# Patient Record
Sex: Female | Born: 1965 | Race: White | Hispanic: No | Smoking: Never smoker
Health system: Southern US, Community
[De-identification: ages and names within clinical notes are randomized; demographics above are authoritative.]

## PROBLEM LIST (undated history)

## (undated) DIAGNOSIS — E063 Autoimmune thyroiditis: Secondary | ICD-10-CM

## (undated) DIAGNOSIS — H539 Unspecified visual disturbance: Secondary | ICD-10-CM

## (undated) DIAGNOSIS — K219 Gastro-esophageal reflux disease without esophagitis: Secondary | ICD-10-CM

## (undated) DIAGNOSIS — J302 Other seasonal allergic rhinitis: Secondary | ICD-10-CM

## (undated) DIAGNOSIS — R112 Nausea with vomiting, unspecified: Secondary | ICD-10-CM

## (undated) DIAGNOSIS — B001 Herpesviral vesicular dermatitis: Secondary | ICD-10-CM

## (undated) DIAGNOSIS — Z9889 Other specified postprocedural states: Secondary | ICD-10-CM

## (undated) DIAGNOSIS — E079 Disorder of thyroid, unspecified: Secondary | ICD-10-CM

## (undated) HISTORY — DX: Unspecified visual disturbance: H53.9

## (undated) HISTORY — DX: Disorder of thyroid, unspecified: E07.9

## (undated) HISTORY — DX: Herpesviral vesicular dermatitis: B00.1

## (undated) HISTORY — DX: Autoimmune thyroiditis: E06.3

---
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
## 1997-09-01 ENCOUNTER — Emergency Department (HOSPITAL_COMMUNITY): Admission: EM | Admit: 1997-09-01 | Discharge: 1997-09-01 | Payer: Self-pay | Admitting: Internal Medicine

## 1998-04-29 ENCOUNTER — Other Ambulatory Visit: Admission: RE | Admit: 1998-04-29 | Discharge: 1998-04-29 | Payer: Self-pay | Admitting: *Deleted

## 1998-05-14 ENCOUNTER — Emergency Department (HOSPITAL_COMMUNITY): Admission: EM | Admit: 1998-05-14 | Discharge: 1998-05-14 | Payer: Self-pay | Admitting: Internal Medicine

## 2000-04-25 ENCOUNTER — Emergency Department (HOSPITAL_COMMUNITY): Admission: EM | Admit: 2000-04-25 | Discharge: 2000-04-25 | Payer: Self-pay | Admitting: Emergency Medicine

## 2000-05-27 ENCOUNTER — Other Ambulatory Visit: Admission: RE | Admit: 2000-05-27 | Discharge: 2000-05-27 | Payer: Self-pay | Admitting: *Deleted

## 2007-01-27 HISTORY — PX: CARPAL TUNNEL RELEASE: SHX101

## 2007-01-27 HISTORY — PX: THYROIDECTOMY: SHX17

## 2009-07-01 ENCOUNTER — Encounter: Admission: RE | Admit: 2009-07-01 | Discharge: 2009-07-01 | Payer: Self-pay

## 2010-01-26 HISTORY — PX: THYROIDECTOMY: SHX17

## 2012-01-04 ENCOUNTER — Ambulatory Visit (INDEPENDENT_AMBULATORY_CARE_PROVIDER_SITE_OTHER): Payer: Medicaid Other | Admitting: Obstetrics and Gynecology

## 2012-01-04 ENCOUNTER — Other Ambulatory Visit (HOSPITAL_COMMUNITY)
Admission: RE | Admit: 2012-01-04 | Discharge: 2012-01-04 | Disposition: A | Discharge status: Home / Self Care | Payer: Medicaid Other | Source: Ambulatory Visit | Attending: Obstetrics and Gynecology | Admitting: Obstetrics and Gynecology

## 2012-01-04 ENCOUNTER — Encounter: Payer: Self-pay | Admitting: Obstetrics and Gynecology

## 2012-01-04 VITALS — BP 107/77 | HR 85 | Temp 98.9°F | Ht 65.0 in | Wt 212.8 lb

## 2012-01-04 DIAGNOSIS — N760 Acute vaginitis: Secondary | ICD-10-CM | POA: Insufficient documentation

## 2012-01-04 DIAGNOSIS — Z302 Encounter for sterilization: Secondary | ICD-10-CM | POA: Insufficient documentation

## 2012-01-04 NOTE — Progress Notes (Signed)
Patient states she's been on antibiotic and thinks she could have a yeast infection

## 2012-01-04 NOTE — Progress Notes (Signed)
  Subjective:    Patient ID: Mikayla Crawford, female    DOB: 12/23/1965, 46 y.o.   MRN: [id number]  HPI  46 yo G2P1011 with BMI 35 and LMP 11/21 presenting today requesting permanent sterilization. Patient is currently sexually active using condoms. Patient also reports vaginal pruritis following an antibiotic course and desires prescription for possible yeast infection. Patient is otherwise without any complaints.  Review of Systems  All other systems reviewed and are negative.       Objective:   Physical Exam  GENERAL: Well-developed, well-nourished female in no acute distress.  HEENT: Normocephalic, atraumatic. Sclerae anicteric.  NECK: Supple. Normal thyroid.  LUNGS: Clear to auscultation bilaterally.  HEART: Regular rate and rhythm. ABDOMEN: Soft, nontender, nondistended. No organomegaly. PELVIC: Normal external female genitalia. Vagina is pink and rugated.  Normal discharge. Normal appearing cervix. Uterus is normal in size. No adnexal mass or tenderness. EXTREMITIES: No cyanosis, clubbing, or edema, 2+ distal pulses.    Assessment & Plan:  46 y.o. yo G2P1011  with undesired fertility, status post vaginal delivery, desires permanent sterilization. Risks and benefits of procedure discussed with patient including permanence of method, bleeding, infection, injury to surrounding organs and need for additional procedures. Risk failure of 0.5-1% with increased risk of ectopic gestation if pregnancy occurs was also discussed with patient. Patient will be scheduled for laparoscopic BTL  Wet prep collected. Patient will be contacted with any abnormal results

## 2012-01-21 ENCOUNTER — Encounter: Payer: Self-pay | Admitting: Obstetrics & Gynecology

## 2012-02-15 ENCOUNTER — Encounter (HOSPITAL_COMMUNITY)
Admission: RE | Admit: 2012-02-15 | Discharge: 2012-02-15 | Disposition: A | Discharge status: Home / Self Care | Payer: Medicaid Other | Source: Ambulatory Visit | Attending: Obstetrics and Gynecology | Admitting: Obstetrics and Gynecology

## 2012-02-15 ENCOUNTER — Encounter (HOSPITAL_COMMUNITY): Payer: Self-pay

## 2012-02-15 ENCOUNTER — Encounter (HOSPITAL_COMMUNITY): Payer: Self-pay | Admitting: Pharmacy Technician

## 2012-02-15 HISTORY — DX: Other seasonal allergic rhinitis: J30.2

## 2012-02-15 HISTORY — DX: Gastro-esophageal reflux disease without esophagitis: K21.9

## 2012-02-15 HISTORY — DX: Other specified postprocedural states: R11.2

## 2012-02-15 HISTORY — DX: Other specified postprocedural states: Z98.890

## 2012-02-15 LAB — CBC
Platelets: 243 10*3/uL (ref 150–400)
RBC: 4.23 MIL/uL (ref 3.87–5.11)

## 2012-02-15 NOTE — Pre-Procedure Instructions (Signed)
Pt states she has some recent congestion-will see primary care this week

## 2012-02-15 NOTE — Patient Instructions (Addendum)
   Your procedure is scheduled on: Thursday January 30th  Enter through the Main Entrance of [facility] at:11:30am Pick up the phone at the desk and dial [phone] and inform us of your arrival.  Please call this number if you have any problems the morning of surgery: [phone]  Remember: Do not eat any solid foods after midnight on Wednesday  You may have clear liquids until 9am on Thursday then nothing You may take your thyroid medication and zyrtec morning of surgery  Do not wear jewelry, make-up, or FINGER nail polish No metal in your hair or on your body. Do not wear lotions, powders, perfumes. You may wear deodorant.  Please use your CHG wash as directed prior to surgery.  Do not shave anywhere for at least 12 hours prior to first CHG shower.  Do not bring valuables to the hospital. Please bring a case to protect your eyeglasses    Patients discharged on the day of surgery will not be allowed to drive home.

## 2012-02-25 ENCOUNTER — Encounter (HOSPITAL_COMMUNITY): Payer: Self-pay | Admitting: Anesthesiology

## 2012-02-25 ENCOUNTER — Encounter (HOSPITAL_COMMUNITY)
Admission: RE | Disposition: A | Discharge status: Home / Self Care | Payer: Self-pay | Source: Ambulatory Visit | Attending: Obstetrics and Gynecology

## 2012-02-25 ENCOUNTER — Ambulatory Visit (HOSPITAL_COMMUNITY): Payer: Medicaid Other | Admitting: Anesthesiology

## 2012-02-25 ENCOUNTER — Ambulatory Visit (HOSPITAL_COMMUNITY)
Admission: RE | Admit: 2012-02-25 | Discharge: 2012-02-25 | Disposition: A | Discharge status: Home / Self Care | Payer: Medicaid Other | Source: Ambulatory Visit | Attending: Obstetrics and Gynecology | Admitting: Obstetrics and Gynecology

## 2012-02-25 DIAGNOSIS — Z01812 Encounter for preprocedural laboratory examination: Secondary | ICD-10-CM | POA: Insufficient documentation

## 2012-02-25 DIAGNOSIS — Z302 Encounter for sterilization: Secondary | ICD-10-CM | POA: Insufficient documentation

## 2012-02-25 DIAGNOSIS — Z01818 Encounter for other preprocedural examination: Secondary | ICD-10-CM | POA: Insufficient documentation

## 2012-02-25 HISTORY — PX: LAPAROSCOPIC TUBAL LIGATION: SHX1937

## 2012-02-25 LAB — PREGNANCY, URINE: Preg Test, Ur: NEGATIVE

## 2012-02-25 SURGERY — LIGATION, FALLOPIAN TUBE, LAPAROSCOPIC
Anesthesia: General | Site: Abdomen | Laterality: Bilateral | Wound class: Clean Contaminated

## 2012-02-25 MED ORDER — NEOSTIGMINE METHYLSULFATE 1 MG/ML IJ SOLN
INTRAMUSCULAR | Status: DC | PRN
  Administered 2012-02-25: 3 mg via INTRAVENOUS

## 2012-02-25 MED ORDER — FENTANYL CITRATE 0.05 MG/ML IJ SOLN
INTRAMUSCULAR | Status: AC
  Administered 2012-02-25: 50 ug via INTRAVENOUS
  Filled 2012-02-25: qty 2

## 2012-02-25 MED ORDER — ONDANSETRON HCL 4 MG/2ML IJ SOLN
INTRAMUSCULAR | Status: DC | PRN
  Administered 2012-02-25: 4 mg via INTRAVENOUS

## 2012-02-25 MED ORDER — MIDAZOLAM HCL 2 MG/2ML IJ SOLN
INTRAMUSCULAR | Status: AC
  Filled 2012-02-25: qty 2

## 2012-02-25 MED ORDER — LIDOCAINE HCL (CARDIAC) 20 MG/ML IV SOLN
INTRAVENOUS | Status: DC | PRN
  Administered 2012-02-25: 80 mg via INTRAVENOUS

## 2012-02-25 MED ORDER — EPHEDRINE 5 MG/ML INJ
INTRAVENOUS | Status: AC
  Filled 2012-02-25: qty 10

## 2012-02-25 MED ORDER — DEXAMETHASONE SODIUM PHOSPHATE 10 MG/ML IJ SOLN
INTRAMUSCULAR | Status: DC | PRN
  Administered 2012-02-25: 10 mg via INTRAVENOUS

## 2012-02-25 MED ORDER — NEOSTIGMINE METHYLSULFATE 1 MG/ML IJ SOLN
INTRAMUSCULAR | Status: AC
  Filled 2012-02-25: qty 1

## 2012-02-25 MED ORDER — GLYCOPYRROLATE 0.2 MG/ML IJ SOLN
INTRAMUSCULAR | Status: DC | PRN
  Administered 2012-02-25: 0.6 mg via INTRAVENOUS

## 2012-02-25 MED ORDER — DEXAMETHASONE SODIUM PHOSPHATE 10 MG/ML IJ SOLN
INTRAMUSCULAR | Status: AC
  Filled 2012-02-25: qty 1

## 2012-02-25 MED ORDER — KETOROLAC TROMETHAMINE 30 MG/ML IJ SOLN: 15.0000 mg | Freq: Once | INTRAMUSCULAR | Status: DC | PRN

## 2012-02-25 MED ORDER — ROCURONIUM BROMIDE 100 MG/10ML IV SOLN
INTRAVENOUS | Status: DC | PRN
  Administered 2012-02-25: 30 mg via INTRAVENOUS

## 2012-02-25 MED ORDER — BUPIVACAINE HCL (PF) 0.25 % IJ SOLN
INTRAMUSCULAR | Status: AC
  Filled 2012-02-25: qty 30

## 2012-02-25 MED ORDER — FENTANYL CITRATE 0.05 MG/ML IJ SOLN
INTRAMUSCULAR | Status: AC
  Filled 2012-02-25: qty 5

## 2012-02-25 MED ORDER — ONDANSETRON HCL 4 MG/2ML IJ SOLN
INTRAMUSCULAR | Status: AC
  Filled 2012-02-25: qty 2

## 2012-02-25 MED ORDER — ONDANSETRON HCL 4 MG/2ML IJ SOLN: 4.0000 mg | Freq: Once | INTRAMUSCULAR | Status: DC | PRN

## 2012-02-25 MED ORDER — BUPIVACAINE HCL (PF) 0.25 % IJ SOLN
INTRAMUSCULAR | Status: DC | PRN
  Administered 2012-02-25: 15 mL

## 2012-02-25 MED ORDER — LIDOCAINE HCL (CARDIAC) 20 MG/ML IV SOLN
INTRAVENOUS | Status: AC
  Filled 2012-02-25: qty 5

## 2012-02-25 MED ORDER — FENTANYL CITRATE 0.05 MG/ML IJ SOLN
25.0000 ug | INTRAMUSCULAR | Status: DC | PRN
  Administered 2012-02-25: 50 ug via INTRAVENOUS

## 2012-02-25 MED ORDER — GLYCOPYRROLATE 0.2 MG/ML IJ SOLN
INTRAMUSCULAR | Status: AC
  Filled 2012-02-25: qty 3

## 2012-02-25 MED ORDER — PROPOFOL 10 MG/ML IV EMUL
INTRAVENOUS | Status: AC
  Filled 2012-02-25: qty 20

## 2012-02-25 MED ORDER — FENTANYL CITRATE 0.05 MG/ML IJ SOLN
INTRAMUSCULAR | Status: DC | PRN
  Administered 2012-02-25: 100 ug via INTRAVENOUS
  Administered 2012-02-25 (×3): 50 ug via INTRAVENOUS

## 2012-02-25 MED ORDER — KETOROLAC TROMETHAMINE 30 MG/ML IJ SOLN
INTRAMUSCULAR | Status: DC | PRN
  Administered 2012-02-25: 30 mg via INTRAVENOUS

## 2012-02-25 MED ORDER — MEPERIDINE HCL 25 MG/ML IJ SOLN: 6.2500 mg | INTRAMUSCULAR | Status: DC | PRN

## 2012-02-25 MED ORDER — LACTATED RINGERS IV SOLN
INTRAVENOUS | Status: DC
  Administered 2012-02-25 (×2): via INTRAVENOUS

## 2012-02-25 MED ORDER — SCOPOLAMINE 1 MG/3DAYS TD PT72
MEDICATED_PATCH | TRANSDERMAL | Status: AC
  Administered 2012-02-25: 1.5 mg via TRANSDERMAL
  Filled 2012-02-25: qty 1

## 2012-02-25 MED ORDER — KETOROLAC TROMETHAMINE 30 MG/ML IJ SOLN
INTRAMUSCULAR | Status: AC
  Filled 2012-02-25: qty 1

## 2012-02-25 MED ORDER — EPHEDRINE SULFATE 50 MG/ML IJ SOLN
INTRAMUSCULAR | Status: DC | PRN
  Administered 2012-02-25: 10 mg via INTRAVENOUS
  Administered 2012-02-25: 5 mg via INTRAVENOUS

## 2012-02-25 MED ORDER — SCOPOLAMINE 1 MG/3DAYS TD PT72
1.0000 | MEDICATED_PATCH | TRANSDERMAL | Status: DC
  Administered 2012-02-25: 1.5 mg via TRANSDERMAL

## 2012-02-25 MED ORDER — PROPOFOL 10 MG/ML IV BOLUS
INTRAVENOUS | Status: DC | PRN
  Administered 2012-02-25: 200 mg via INTRAVENOUS

## 2012-02-25 MED ORDER — OXYCODONE-ACETAMINOPHEN 5-325 MG PO TABS: 1.0000 | ORAL_TABLET | ORAL | Status: DC | PRN

## 2012-02-25 MED ORDER — IBUPROFEN 600 MG PO TABS: 600.0000 mg | ORAL_TABLET | Freq: Four times a day (QID) | ORAL | Status: DC | PRN

## 2012-02-25 MED ORDER — ROCURONIUM BROMIDE 50 MG/5ML IV SOLN
INTRAVENOUS | Status: AC
  Filled 2012-02-25: qty 1

## 2012-02-25 MED ORDER — MIDAZOLAM HCL 5 MG/5ML IJ SOLN
INTRAMUSCULAR | Status: DC | PRN
  Administered 2012-02-25: 2 mg via INTRAVENOUS

## 2012-02-25 SURGICAL SUPPLY — 22 items
ADH SKN CLS APL DERMABOND .7 (GAUZE/BANDAGES/DRESSINGS) ×1
CHLORAPREP W/TINT 26ML (MISCELLANEOUS) ×2 IMPLANT
DERMABOND ADVANCED (GAUZE/BANDAGES/DRESSINGS) ×1
DERMABOND ADVANCED .7 DNX12 (GAUZE/BANDAGES/DRESSINGS) IMPLANT
ELECT REM PT RETURN 9FT ADLT (ELECTROSURGICAL) ×2
ELECTRODE REM PT RTRN 9FT ADLT (ELECTROSURGICAL) IMPLANT
GLOVE BIOGEL PI IND STRL 6.5 (GLOVE) ×2 IMPLANT
GLOVE BIOGEL PI INDICATOR 6.5 (GLOVE) ×2
GLOVE SURG SS PI 6.0 STRL IVOR (GLOVE) ×2 IMPLANT
GOWN PREVENTION PLUS LG XLONG (DISPOSABLE) ×4 IMPLANT
NS IRRIG 1000ML POUR BTL (IV SOLUTION) ×2 IMPLANT
PACK LAPAROSCOPY BASIN (CUSTOM PROCEDURE TRAY) ×2 IMPLANT
SEALER TISSUE G2 CVD JAW 35 (ENDOMECHANICALS) IMPLANT
SEALER TISSUE G2 CVD JAW 45CM (ENDOMECHANICALS) ×1
SUT MON AB 4-0 PS1 27 (SUTURE) ×2 IMPLANT
SUT VICRYL 0 UR6 27IN ABS (SUTURE) ×2 IMPLANT
TOWEL OR 17X24 6PK STRL BLUE (TOWEL DISPOSABLE) ×4 IMPLANT
TRAY FOLEY BAG SILVER LF 14FR (CATHETERS) ×2 IMPLANT
TROCAR BALLN 12MMX100 BLUNT (TROCAR) ×1 IMPLANT
TROCAR XCEL NON-BLD 11X100MML (ENDOMECHANICALS) IMPLANT
TROCAR XCEL NON-BLD 5MMX100MML (ENDOMECHANICALS) ×1 IMPLANT
WATER STERILE IRR 1000ML POUR (IV SOLUTION) ×2 IMPLANT

## 2012-02-25 NOTE — Anesthesia Postprocedure Evaluation (Signed)
  Anesthesia Post-op Note  Patient: Mikayla Crawford  Procedure(s) Performed: Procedure(s) (LRB) with comments: LAPAROSCOPIC TUBAL LIGATION (Bilateral)  Patient Location: PACU  Anesthesia Type:General  Level of Consciousness: awake, alert  and oriented  Airway and Oxygen Therapy: Patient Spontanous Breathing  Post-op Pain: mild  Post-op Assessment: Post-op Vital signs reviewed, Patient's Cardiovascular Status Stable, Respiratory Function Stable, Patent Airway, No signs of Nausea or vomiting and Pain level controlled  Post-op Vital Signs: Reviewed and stable  Complications: No apparent anesthesia complications

## 2012-02-25 NOTE — H&P (Signed)
Mikayla Crawford is an 47 y.o. female G2P1011 presenting today for scheduled bilateral tubal ligation. Patient with undesired fertility and desires permanent sterilization  Pertinent Gynecological History: Menses: flow is moderate Bleeding: monthly Contraception: condoms DES exposure: denies Blood transfusions: none Sexually transmitted diseases: no past history Previous GYN Procedures: DNC  Last mammogram: normal Date: 2011 OB History: G2, P1011   Menstrual History:  No LMP recorded.    Past Medical History  Diagnosis Date  . Thyroid disease     no thyroid  . Hashimoto's disease   . PONV (postoperative nausea and vomiting)   . Seasonal allergies   . GERD (gastroesophageal reflux disease)     tums prn for occas reflux    Past Surgical History  Procedure Date  . Carpal tunnel release 2009  . Thyroidectomy 2009  . Tonsillectomy 1988  . Thyroidectomy 1/12    Family History  Problem Relation Age of Onset  . Macular degeneration Mother   . Stroke Mother   . Glaucoma Mother   . Heart disease Mother   . Heart disease Father   . Diabetes Father   . Macular degeneration Father   . Crohn's disease Daughter   . Diabetes Brother   . Cancer Maternal Grandmother     Social History:  reports that she has never smoked. She has never used smokeless tobacco. She reports that she does not drink alcohol or use illicit drugs.  Allergies: No Known Allergies  Prescriptions prior to admission  Medication Sig Dispense Refill  . CALCIUM & MAGNESIUM CARBONATES PO Take 1 tablet by mouth daily.       . Capsicum, Cayenne, (CAYENNE PEPPER PO) Take 1 tablet by mouth daily as needed. For arthritis      . cetirizine (ZYRTEC) 10 MG tablet Take 10 mg by mouth daily.      . Melatonin 5 MG TABS Take 5-10 mg by mouth at bedtime.      Marland Kitchen PRESCRIPTION MEDICATION Take 1 tablet by mouth daily. [facility] compounded RX:  Liothyronine & Levothryoxine      . pseudoephedrine (SUDAFED) 30  MG tablet Take 30 mg by mouth daily as needed. For sinus congestion        Review of Systems  All other systems reviewed and are negative.    Blood pressure 119/78, pulse 90, temperature 98.1 F (36.7 C), temperature source Oral, resp. rate 18, SpO2 100.00%. Physical Exam GENERAL: Well-developed, well-nourished female in no acute distress.  HEENT: Normocephalic, atraumatic. Sclerae anicteric.  NECK: Supple. Normal thyroid.  LUNGS: Clear to auscultation bilaterally.  HEART: Regular rate and rhythm. BREASTS: Symmetric in size. No palpable masses or lymphadenopathy, skin changes, or nipple drainage. ABDOMEN: Soft, nontender, nondistended. No organomegaly. PELVIC: Deferred to OR EXTREMITIES: No cyanosis, clubbing, or edema, 2+ distal pulses.  Results for orders placed during the hospital encounter of 02/25/12 (from the past 24 hour(s))  PREGNANCY, URINE     Status: Normal   Collection Time   02/25/12 11:24 AM      Component Value Range   Preg Test, Ur NEGATIVE  NEGATIVE    No results found.  Assessment/Plan: 47 y.o. yo G2P1011  with undesired fertility,status post vaginal delivery, desires permanent sterilization. Risks and benefits of procedure discussed with patient including permanence of method, bleeding, infection, injury to surrounding organs and need for additional procedures. Risk failure of 0.5-1% with increased risk of ectopic gestation if pregnancy occurs was also discussed with patient.     Normalee Sistare  02/25/2012, 12:03 PM

## 2012-02-25 NOTE — Anesthesia Procedure Notes (Signed)
Procedure Name: Intubation Date/Time: 02/25/2012 1:06 PM Performed by: Graciela Husbands Pre-anesthesia Checklist: Patient identified, Patient being monitored, Timeout performed, Emergency Drugs available and Suction available Patient Re-evaluated:Patient Re-evaluated prior to inductionOxygen Delivery Method: Circle system utilized Preoxygenation: Pre-oxygenation with 100% oxygen Intubation Type: IV induction Ventilation: Mask ventilation without difficulty Laryngoscope Size: Mac and 3 Grade View: Grade I Tube size: 7.0 mm Airway Equipment and Method: Stylet Placement Confirmation: ETT inserted through vocal cords under direct vision,  breath sounds checked- equal and bilateral and positive ETCO2 Secured at: 21 cm Tube secured with: Tape Dental Injury: Teeth and Oropharynx as per pre-operative assessment

## 2012-02-25 NOTE — Anesthesia Preprocedure Evaluation (Addendum)
Anesthesia Evaluation  Patient identified by MRN, date of birth, ID band Patient awake    Reviewed: Allergy & Precautions, H&P , NPO status , Patient's Chart, lab work & pertinent test results  Airway Mallampati: II TM Distance: >3 FB Neck ROM: full    Dental No notable dental hx. (+) Teeth Intact   Pulmonary neg pulmonary ROS,    Pulmonary exam normal       Cardiovascular negative cardio ROS      Neuro/Psych negative neurological ROS  negative psych ROS   GI/Hepatic Neg liver ROS,   Endo/Other  negative endocrine ROS  Renal/GU negative Renal ROS  negative genitourinary   Musculoskeletal negative musculoskeletal ROS (+)   Abdominal Normal abdominal exam  (+)   Peds negative pediatric ROS (+)  Hematology negative hematology ROS (+)   Anesthesia Other Findings   Reproductive/Obstetrics negative OB ROS                           Anesthesia Physical Anesthesia Plan  ASA: II  Anesthesia Plan: General   Post-op Pain Management:    Induction: Intravenous  Airway Management Planned: Oral ETT  Additional Equipment:   Intra-op Plan:   Post-operative Plan: Extubation in OR  Informed Consent: I have reviewed the patients History and Physical, chart, labs and discussed the procedure including the risks, benefits and alternatives for the proposed anesthesia with the patient or authorized representative who has indicated his/her understanding and acceptance.   Dental Advisory Given  Plan Discussed with: CRNA and Surgeon  Anesthesia Plan Comments:        Anesthesia Quick Evaluation

## 2012-02-25 NOTE — Op Note (Signed)
Mikayla Crawford 02/25/2012  PREOPERATIVE DIAGNOSIS:  Undesired fertility  POSTOPERATIVE DIAGNOSIS:  Undesired fertility  PROCEDURE:  Laparoscopic Bilateral salpingectomy  SURGEON: Dr. Catalina Antigua  ANESTHESIA:  General endotracheal  COMPLICATIONS:  None immediate.  ESTIMATED BLOOD LOSS:  Less than 20 ml.  FLUIDS: 1200 ml LR.  URINE OUTPUT:  200 ml of clear urine.  INDICATIONS: 47 y.o. G2P1011  with undesired fertility, desires permanent sterilization. Other reversible forms of contraception were discussed with patient; she declines all other modalities.  Risks of procedure discussed with patient including permanence of method, bleeding, infection, injury to surrounding organs and need for additional procedures including laparotomy, risk of regret.  Failure risk of 0.5-1% with increased risk of ectopic gestation if pregnancy occurs was also discussed with patient.      FINDINGS:  Normal uterus, tubes, and ovaries.  TECHNIQUE:  The patient was taken to the operating room where general anesthesia was obtained without difficulty.  She was then placed in the dorsal lithotomy position and prepared and draped in sterile fashion.  After an adequate timeout was performed, a bivalved speculum was then placed in the patient's vagina, and the anterior lip of cervix grasped with the single-tooth tenaculum.  The uterine manipulator was then advanced into the uterus.  The speculum was removed from the vagina.  Attention was then turned to the patient's abdomen where a 10-mm skin incision was made on the umbilical fold.  The underlying fascia was identified, grasped with Kocher clamps, tented up and entered sharply with mayo scissors. The fascia was tagged with 0-Vicryl. The peritoneum was entered bluntly.The 11 mm trocar and sleeve were introduced into the abdominal cavityl.  Intraperitoneal placement was confirmed with the use of the laparoscope. Pneumoperitoneum was achieved by the insufflation of CO2  gas. A survey of the patient's pelvis and abdomen revealed entirely normal anatomy.  The fallopian tubes were observed and found to be normal in appearance. Two 5-mm trocar and sleeve were introduced into the abdomen under direct visualization. The first 5-mm trocar was inserted 2 cm above and medical to the superior iliac spin; the second was placed 5 cm cephalad to the first one. The right fallopian tube was identified to its fimbriated end. Using the Enseal devise salpingectomy was performed by transecting the mesosalpinx and transecting the tube at the cornual region.  A similar process was carried out on the contralateral side allowing for bilateral salpingectomy.   Good hemostasis was noted overall. The instruments were then removed from the patient's abdomen and the fascial incision was repaired with 0 Vicryl, and the skin was closed with 4-0 Vicryl. The uterine manipulator and the tenaculum were removed from the vagina without complications. The patient tolerated the procedure well.  Sponge, lap, and needle counts were correct times two.  The patient was then taken to the recovery room awake, extubated and in stable  in stable condition.

## 2012-02-25 NOTE — Transfer of Care (Signed)
Immediate Anesthesia Transfer of Care Note  Patient: Mikayla Crawford  Procedure(s) Performed: Procedure(s) (LRB) with comments: LAPAROSCOPIC TUBAL LIGATION (Bilateral)  Patient Location: PACU  Anesthesia Type:General  Level of Consciousness: awake, alert  and oriented  Airway & Oxygen Therapy: Patient Spontanous Breathing and Patient connected to nasal cannula oxygen  Post-op Assessment: Report given to PACU RN and Post -op Vital signs reviewed and stable  Post vital signs: Reviewed and stable  Complications: No apparent anesthesia complications

## 2012-02-26 ENCOUNTER — Encounter (HOSPITAL_COMMUNITY): Payer: Self-pay | Admitting: Obstetrics and Gynecology

## 2012-02-26 ENCOUNTER — Encounter: Payer: Self-pay | Admitting: Obstetrics and Gynecology

## 2012-03-10 ENCOUNTER — Ambulatory Visit: Payer: Medicaid Other | Admitting: Obstetrics and Gynecology

## 2012-03-23 ENCOUNTER — Encounter: Payer: Self-pay | Admitting: Obstetrics and Gynecology

## 2012-03-23 ENCOUNTER — Ambulatory Visit: Payer: Medicaid Other | Admitting: Obstetrics and Gynecology

## 2012-03-23 ENCOUNTER — Ambulatory Visit (INDEPENDENT_AMBULATORY_CARE_PROVIDER_SITE_OTHER): Payer: Medicaid Other | Admitting: Obstetrics and Gynecology

## 2012-03-23 VITALS — BP 126/83 | HR 110 | Temp 100.2°F | Ht 65.0 in | Wt 216.0 lb

## 2012-03-23 DIAGNOSIS — Z09 Encounter for follow-up examination after completed treatment for conditions other than malignant neoplasm: Secondary | ICD-10-CM

## 2012-03-23 NOTE — Progress Notes (Signed)
  Subjective:    Patient ID: Mikayla Crawford, female    DOB: 02/03/1965, 47 y.o.   MRN: [id number]  HPI 47 yo G2P1011 s/p laparoscopic bilateral salpingectomy on 1/30 presenting today for post-op check. Patient has been doing well and is without any complaints. Patient denies any fevers, chills, abnormal drainage from incision   Review of Systems  All other systems reviewed and are negative.       Objective:   Physical Exam GENERAL: Well-developed, well-nourished female in no acute distress.  HEENT: Normocephalic, atraumatic. Sclerae anicteric.  NECK: Supple. Normal thyroid.  LUNGS: Clear to auscultation bilaterally.  HEART: Regular rate and rhythm. BREASTS: Symmetric in size. No palpable masses or lymphadenopathy, skin changes, or nipple drainage. ABDOMEN: Soft, nontender, nondistended. No organomegaly. Incision: no erythema, induration or drainage x 3. Incision healed very well. PELVIC: Not indicated EXTREMITIES: No cyanosis, clubbing, or edema, 2+ distal pulses.     Assessment & Plan:  47 yo s/p laparoscopic salpingectomy for permanent sterilization - Patient medically cleared to resume all activities - Referral for screening mammography provided - patient reports normal pap smear a few months ago.  - [facility] is an [facility] and [facility] [address] [place] - RTC prn

## 2012-04-01 ENCOUNTER — Ambulatory Visit (HOSPITAL_COMMUNITY): Payer: Self-pay

## 2012-04-06 ENCOUNTER — Ambulatory Visit (HOSPITAL_COMMUNITY)
Admission: RE | Admit: 2012-04-06 | Discharge: 2012-04-06 | Disposition: A | Discharge status: Home / Self Care | Payer: Self-pay | Source: Ambulatory Visit | Attending: Obstetrics and Gynecology | Admitting: Obstetrics and Gynecology

## 2012-04-06 DIAGNOSIS — Z09 Encounter for follow-up examination after completed treatment for conditions other than malignant neoplasm: Secondary | ICD-10-CM

## 2012-04-06 DIAGNOSIS — Z1231 Encounter for screening mammogram for malignant neoplasm of breast: Secondary | ICD-10-CM

## 2012-04-27 ENCOUNTER — Other Ambulatory Visit: Payer: Self-pay | Admitting: Obstetrics and Gynecology

## 2012-04-27 DIAGNOSIS — R928 Other abnormal and inconclusive findings on diagnostic imaging of breast: Secondary | ICD-10-CM

## 2012-05-10 ENCOUNTER — Ambulatory Visit (HOSPITAL_COMMUNITY): Payer: Medicaid Other

## 2012-05-23 ENCOUNTER — Other Ambulatory Visit: Payer: Medicaid Other

## 2012-08-22 ENCOUNTER — Ambulatory Visit (INDEPENDENT_AMBULATORY_CARE_PROVIDER_SITE_OTHER): Payer: Medicaid Other | Admitting: Endocrinology

## 2012-08-22 ENCOUNTER — Encounter: Payer: Self-pay | Admitting: Endocrinology

## 2012-08-22 VITALS — BP 130/90 | HR 85 | Temp 98.5°F | Resp 12 | Ht 65.0 in | Wt 234.9 lb

## 2012-08-22 DIAGNOSIS — R5383 Other fatigue: Secondary | ICD-10-CM

## 2012-08-22 DIAGNOSIS — E890 Postprocedural hypothyroidism: Secondary | ICD-10-CM | POA: Insufficient documentation

## 2012-08-22 DIAGNOSIS — R5381 Other malaise: Secondary | ICD-10-CM

## 2012-08-22 LAB — T4, FREE: Free T4: 0.17 ng/dL — ABNORMAL LOW (ref 0.60–1.60)

## 2012-08-22 LAB — TSH: TSH: 90.19 u[IU]/mL — ABNORMAL HIGH (ref 0.35–5.50)

## 2012-08-22 NOTE — Patient Instructions (Addendum)
To be tested today

## 2012-08-22 NOTE — Progress Notes (Signed)
Patient ID: Mikayla Crawford, female   DOB: [birth year], 47 y.o.   MRN: [id number]  Reason for Appointment:  Hypothyroidism, new visit    History of Present Illness:   She has history of hypothyroidism since 2009.  At that time she was having symptoms like weight gain, hoarseness, carpal tunnel syndrome, fatigue and hair loss. Further details or baseline lab records are not available. She was then started on levothyroxine supplements. With treatment she did not feel better and then went to a holistic practitioner who put her on T3 and T4 separately. She says she felt energetic in afternoons with starting this regimen.   She underwent total thyroidectomy on February 20, 2009 for multinodular goiter because of symptoms of hoarseness, choking and local pressure. Final pathology report was benign.  She was subsequently on levothyroxine 150 rncg a day. With this her thyroid levels were normal in 2012, TSH 2.3.  However  despite taking the medication consistently she was feeling tired and had difficulty losing weight and wanted to go back to the T4 and T3 combination. However not clear what doses of T4 and T3 she had been taking She was tried on a regimen of 120 mg Armour thyroid but she didn't feel any better with this  She was then given a compounded preparation of T4 and T3 by her primary care physician and has been followed there since then. MEDICATION history: 07/03/11: Prescription was a combination of 25 mcg of Cytomel and 150 of levothyroxine and was changed to levothyroxine 225 mcg on 03/25/12 which has been continued. She does not want to take 2 separate tablets and [facility]  The response to therapy has been incomplete.  She does complain of feeling tired now but she thinks this is from getting inadequate sleep for the last 3 months from having to take care of her father. She does not complain of any hair loss, cold intolerance, puffiness of hands or feet  or constipation. She has not had any recent weight change although over the last couple of years she has lost and regained weight  She has been very compliant with taking her medication daily and does not take any other medications or OTC products for at least 2 hours after this  However apparently her TSH about 3 weeks ago was 145 and she was referred here for further management     Past Medical History  Diagnosis Date  . Thyroid disease     no thyroid  . Hashimoto's disease   . PONV (postoperative nausea and vomiting)   . Seasonal allergies   . GERD (gastroesophageal reflux disease)     tums prn for occas reflux    Past Surgical History  Procedure Laterality Date  . Carpal tunnel release  2009  . Thyroidectomy  2009  . Tonsillectomy  1988  . Thyroidectomy  1/12  . Laparoscopic tubal ligation  02/25/2012    Procedure: LAPAROSCOPIC TUBAL LIGATION;  Surgeon: Catalina Antigua, MD;  Location: [facility];  Service: Gynecology;  Laterality: Bilateral;    Family History  Problem Relation Age of Onset  . Macular degeneration Mother   . Stroke Mother   . Glaucoma Mother   . Heart disease Mother   . Heart disease Father   . Diabetes Father   . Macular degeneration Father   . Crohn's disease Daughter   . Diabetes Brother   . Cancer Maternal Grandmother     Social History:  reports that  she has never smoked. She has never used smokeless tobacco. She reports that she does not drink alcohol or use illicit drugs.  Allergies: No Known Allergies    Medication List       This list is accurate as of: 08/22/12  1:49 PM.  Always use your most recent med list.               CALCIUM & MAGNESIUM CARBONATES PO  Take 1 tablet by mouth daily.     CAYENNE PEPPER PO  Take 1 tablet by mouth daily as needed. For arthritis     cetirizine 10 MG tablet  Commonly known as:  ZYRTEC  Take 10 mg by mouth daily.     HYDROcodone-acetaminophen 10-325 MG per tablet  Commonly known as:  NORCO   Take 1 tablet by mouth every 6 (six) hours as needed for pain.     ibuprofen 600 MG tablet  Commonly known as:  ADVIL,MOTRIN  Take 1 tablet (600 mg total) by mouth every 6 (six) hours as needed for pain.     levothyroxine 200 MCG tablet  Commonly known as:  SYNTHROID, LEVOTHROID  Take 225 mcg by mouth daily. Take 225 mcg daily     Melatonin 5 MG Tabs  Take 5-10 mg by mouth at bedtime.     NON FORMULARY  1 capsule. Flax seed  Or krill oil capsules - 1 per day     pseudoephedrine 30 MG tablet  Commonly known as:  SUDAFED  Take 30 mg by mouth daily as needed. For sinus congestion     traMADol 50 MG tablet  Commonly known as:  ULTRAM  Take 50 mg by mouth every 6 (six) hours as needed for pain.        Review of Systems:  She is complaining about tendency to weight gain She does have regular menstrual cycles She has had problems with low back pain and takes tramadol Has had problems with rotator cuff inflammation on the right and has received cortisone injections  CARDIOLOGY: no history of high blood pressure.            GASTROENTEROLOGY:  no Change in bowel habits.      ENDOCRINOLOGY:  no history of Diabetes.             Examination:    BP 130/90  Pulse 85  Temp(Src) 98.5 F (36.9 C)  Resp 12  Ht 5\' 5"  (1.651 m)  Wt 234 lb 14.4 oz (106.55 kg)  BMI 39.09 kg/m2  SpO2 98%  LMP 08/15/2012   General Appearance: pleasant, alertand conversing normally  Eyes: No prominence of the eyes, lid retraction or conjunctival pallor She appears to have mild facial puffiness         Neck: The thyroid is nonpalpable. There is no lymphadenopathy .    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  She does not appear significantly hypothyroid today and not clear why her TSH was 145 earlier this month She appears to be very compliant with her levothyroxine Her dosage was confirmed from  her pharmacy bottle which does indicate she is taking 0.225 mg However it's possible that her formulation was incorrect causing significant hypothyroidism  Will need to recheck her TSH today to confirm and adjust her medications accordingly She does prefer to take a combination of T4 and T3 and will discuss this further with the patient when they labs are available  [facility] 08/22/2012, 1:49 PM   Addendum: TSH is still high at 90,  she will get Synthroid 112 mcg, 2 tablets daily. However we'll need reports of previous labs done with her PCP; if the TSH was normal patient may be able to go back to her original regimen of the compounded preparation of T3, 25 mcg with T4, 150 mcg

## 2012-08-23 ENCOUNTER — Telehealth: Payer: Self-pay | Admitting: *Deleted

## 2012-08-23 ENCOUNTER — Other Ambulatory Visit: Payer: Self-pay | Admitting: *Deleted

## 2012-08-23 LAB — T3: T3, Total: 29.6 ng/dL — ABNORMAL LOW (ref 80.0–204.0)

## 2012-08-23 MED ORDER — LEVOTHYROXINE SODIUM 112 MCG PO TABS: ORAL_TABLET | ORAL | Status: DC

## 2012-08-23 NOTE — Telephone Encounter (Signed)
Pt is aware of lab results, rx was sent to [facility] in [place]

## 2012-08-23 NOTE — Telephone Encounter (Signed)
Message copied by Hermenia Bers on Tue Aug 23, 2012  4:03 PM ------      Message from: Reather Littler      Created: Mon Aug 22, 2012  9:57 PM       Her thyroid level is again very low, suspect incorrect formulation from the pharmacy, would recommend getting Synthroid 112 mcg, 2 tablets daily from regular pharmacy      to avoid any further errors ------

## 2012-09-21 ENCOUNTER — Encounter: Payer: Self-pay | Admitting: Endocrinology

## 2012-09-21 ENCOUNTER — Ambulatory Visit (INDEPENDENT_AMBULATORY_CARE_PROVIDER_SITE_OTHER): Payer: Self-pay | Admitting: Endocrinology

## 2012-09-21 ENCOUNTER — Other Ambulatory Visit: Payer: Self-pay | Admitting: *Deleted

## 2012-09-21 ENCOUNTER — Ambulatory Visit: Payer: Self-pay | Admitting: Endocrinology

## 2012-09-21 VITALS — BP 114/70 | HR 91 | Temp 98.6°F | Resp 12 | Ht 65.0 in | Wt 230.5 lb

## 2012-09-21 DIAGNOSIS — E890 Postprocedural hypothyroidism: Secondary | ICD-10-CM

## 2012-09-21 MED ORDER — LEVOTHYROXINE SODIUM 112 MCG PO TABS: ORAL_TABLET | ORAL | Status: DC

## 2012-09-21 NOTE — Progress Notes (Signed)
Patient ID: Mikayla Crawford, female   DOB: 16-Nov-1965, 47 y.o.   MRN: [id number]  Reason for Appointment:  Hypothyroidism, new visit    History of Present Illness:   Less tired 1 wk,   She has history of hypothyroidism since 2009.  Past history: At diagnosis she was having symptoms like weight gain, hoarseness, carpal tunnel syndrome, fatigue and hair loss. Further details or baseline lab records are not available. She was then started on levothyroxine supplements. With treatment she did not feel better and then went to a holistic practitioner who put her on T3 and T4 separately.  She underwent total thyroidectomy on February 20, 2009 for multinodular goiter because of symptoms of hoarseness, choking and local pressure. Final pathology report was benign.  She was subsequently on levothyroxine 150 rncg a day. With this her thyroid levels were normal in 2012, TSH 2.3. She was tried on a regimen of 120 mg Armour thyroid but she didn't feel any better with this MEDICATION history: 07/03/11: Prescription was a combination of 25 mcg of Cytomel and 150 of levothyroxine and was changed to levothyroxine 225 mcg on 03/25/12 which has been continued.  Marland KitchenRECENT history:She had complaints of feeling tired and her TSH was found to be 145 in early July. She does not complain of any hair loss, cold intolerance, puffiness of hands or feet or constipation. She has not had any recent weight change although over the last couple of years she has lost and regained weight She had been very compliant with taking her medication daily and does not take any other medications or OTC products for at least 2 hours after this  With switching to 112 mcg of Synthroid 2 tablets daily she has felt less tired especially in the last week    Lab Results  Component Value Date   TSH 90.19* 08/22/2012       Past Medical History  Diagnosis Date  . Thyroid disease     no thyroid  . Hashimoto's disease   . PONV (postoperative  nausea and vomiting)   . Seasonal allergies   . GERD (gastroesophageal reflux disease)     tums prn for occas reflux    Past Surgical History  Procedure Laterality Date  . Carpal tunnel release  2009  . Thyroidectomy  2009  . Tonsillectomy  1988  . Thyroidectomy  1/12  . Laparoscopic tubal ligation  02/25/2012    Procedure: LAPAROSCOPIC TUBAL LIGATION;  Surgeon: Catalina Antigua, MD;  Location: [facility];  Service: Gynecology;  Laterality: Bilateral;    Family History  Problem Relation Age of Onset  . Macular degeneration Mother   . Stroke Mother   . Glaucoma Mother   . Heart disease Mother   . Heart disease Father   . Diabetes Father   . Macular degeneration Father   . Crohn's disease Daughter   . Diabetes Brother   . Cancer Maternal Grandmother     Social History:  reports that she has never smoked. She has never used smokeless tobacco. She reports that she does not drink alcohol or use illicit drugs.  Allergies: No Known Allergies    Medication List       This list is accurate as of: 09/21/12  4:06 PM.  Always use your most recent med list.               CALCIUM & MAGNESIUM CARBONATES PO  Take 1 tablet by mouth daily.     CAYENNE PEPPER PO  Take 1 tablet by mouth daily as needed. For arthritis     cetirizine 10 MG tablet  Commonly known as:  ZYRTEC  Take 10 mg by mouth daily.     HYDROcodone-acetaminophen 10-325 MG per tablet  Commonly known as:  NORCO  Take 1 tablet by mouth every 6 (six) hours as needed for pain.     ibuprofen 600 MG tablet  Commonly known as:  ADVIL,MOTRIN  Take 1 tablet (600 mg total) by mouth every 6 (six) hours as needed for pain.     levothyroxine 112 MCG tablet  Commonly known as:  SYNTHROID, LEVOTHROID  Take 2 tablets by mouth once a day on an empty stomach     Melatonin 5 MG Tabs  Take 5-10 mg by mouth at bedtime.     methocarbamol 500 MG tablet  Commonly known as:  ROBAXIN  Take 500 mg by mouth 4 (four) times daily.      NON FORMULARY  1 capsule. Flax seed  Or krill oil capsules - 1 per day     pseudoephedrine 30 MG tablet  Commonly known as:  SUDAFED  Take 30 mg by mouth daily as needed. For sinus congestion     traMADol 50 MG tablet  Commonly known as:  ULTRAM  Take 50 mg by mouth every 6 (six) hours as needed for pain.        Review of Systems:  She is complaining about tendency to weight gain She does have regular menstrual cycles She has had problems with low back pain and takes tramadol Has had problems with rotator cuff inflammation on the right and has received cortisone injections  CARDIOLOGY: no history of high blood pressure.            GASTROENTEROLOGY:  no Change in bowel habits.      ENDOCRINOLOGY:  no history of Diabetes.          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  Had a very high TSH possibly related to improper dispensing from the pharmacy and was confirmed in July also She is subjectively better with increasing the dose to 225 mcg which confirms the above. She appears to be very compliant with her levothyroxine She does not appear  hypothyroid today and not clear why her TSH was 145 earlier this month  Will need to recheck her TSH today to adjust her dosage further She does prefer to take a combination of T4 and T3 and will determine the dose when labs are available  [facility] 09/21/2012, 4:06 PM    Addendum: her TSH is only 0.63, most likely she had erroneous  tablets given by pharmacy recently. Since her TSH has come down sharply will only give her the equivalent of 200 mcg of levothyroxine using 25 mcg of Cytomel and 100 mcg of levothyroxine and followup in 6 weeks

## 2012-09-21 NOTE — Patient Instructions (Addendum)
To have labs today

## 2012-09-23 ENCOUNTER — Telehealth: Payer: Self-pay | Admitting: *Deleted

## 2012-09-23 NOTE — Telephone Encounter (Signed)
Message copied by Hermenia Bers on Fri Sep 23, 2012  8:04 AM ------      Message from: Reather Littler      Created: Thu Sep 22, 2012  9:44 PM       Thyroid level high normal, new prescriptions for levothyroxine 100 mcg once a day and also Cytomel 25 mcg, half tablet twice a day; followup in 6-8 weeks as scheduled ------

## 2012-09-23 NOTE — Telephone Encounter (Signed)
[facility], they need to compound the equivalent of 170 MG Armour thyroid once a day

## 2012-11-21 ENCOUNTER — Other Ambulatory Visit (INDEPENDENT_AMBULATORY_CARE_PROVIDER_SITE_OTHER): Payer: Self-pay

## 2012-11-21 DIAGNOSIS — E890 Postprocedural hypothyroidism: Secondary | ICD-10-CM

## 2012-11-21 LAB — T3, FREE: T3, Free: 2.7 pg/mL (ref 2.3–4.2)

## 2012-11-24 ENCOUNTER — Encounter: Payer: Self-pay | Admitting: Endocrinology

## 2012-11-24 ENCOUNTER — Ambulatory Visit (INDEPENDENT_AMBULATORY_CARE_PROVIDER_SITE_OTHER): Payer: Self-pay | Admitting: Endocrinology

## 2012-11-24 VITALS — BP 114/72 | HR 97 | Temp 98.3°F | Resp 12 | Ht 65.0 in | Wt 234.1 lb

## 2012-11-24 DIAGNOSIS — E890 Postprocedural hypothyroidism: Secondary | ICD-10-CM

## 2012-11-24 NOTE — Progress Notes (Signed)
Patient ID: Mikayla Crawford, female   DOB: 1965-04-27, 47 y.o.   MRN: [id number]  Reason for Appointment:  Hypothyroidism, new visit    History of Present Illness:   She has history of hypothyroidism since 2009.  Past history: At diagnosis she was having symptoms like weight gain, hoarseness, carpal tunnel syndrome, fatigue and hair loss. Further details or baseline lab records are not available. She was then started on levothyroxine supplements. With treatment she did not feel better and then went to a holistic practitioner who put her on T3 and T4 separately.  She underwent total thyroidectomy on February 20, 2009 for multinodular goiter because of symptoms of hoarseness, choking and local pressure. Final pathology report was benign.  She was subsequently on levothyroxine 150 rncg a day. With this her thyroid levels were normal in 2012, TSH 2.3. She was tried on a regimen of 120 mg Armour thyroid but she didn't feel any better with this 07/03/11: Prescription was a combination of 25 mcg of Cytomel and 150 of levothyroxine and was changed to levothyroxine 225 mcg on 03/25/12  She had complaints of feeling tired and her TSH was found to be 145 in early July.  Marland KitchenRECENT history: She had been taking 224 mcg of Synthroid on her last visit but she was still having some fatigue She thinks she had felt better with taking a custom-made preparation of Armour Thyroid and wanted to change She was switched to a compounded preparation of 170 mg of Armour Thyroid about 6 weeks ago at the end of 8/14 and she thinks her energy level is better with this She does not complain of any hair loss, cold intolerance, puffiness of hands or feet or constipation. She had been very compliant with taking her medication daily and does not take any other medications or OTC products for at least 2 hours after this   Lab Results  Component Value Date   TSH 1.21 11/21/2012   TSH 0.63 09/21/2012   TSH 90.19* 08/22/2012   FREET4  0.58* 11/21/2012   FREET4 1.45 09/21/2012   FREET4 0.17* 08/22/2012      Past Medical History  Diagnosis Date  . Thyroid disease     no thyroid  . Hashimoto's disease   . PONV (postoperative nausea and vomiting)   . Seasonal allergies   . GERD (gastroesophageal reflux disease)     tums prn for occas reflux    Past Surgical History  Procedure Laterality Date  . Carpal tunnel release  2009  . Thyroidectomy  2009  . Tonsillectomy  1988  . Thyroidectomy  1/12  . Laparoscopic tubal ligation  02/25/2012    Procedure: LAPAROSCOPIC TUBAL LIGATION;  Surgeon: Catalina Antigua, MD;  Location: [facility];  Service: Gynecology;  Laterality: Bilateral;    Family History  Problem Relation Age of Onset  . Macular degeneration Mother   . Stroke Mother   . Glaucoma Mother   . Heart disease Mother   . Heart disease Father   . Diabetes Father   . Macular degeneration Father   . Crohn's disease Daughter   . Diabetes Brother   . Cancer Maternal Grandmother     Social History:  reports that she has never smoked. She has never used smokeless tobacco. She reports that she does not drink alcohol or use illicit drugs.  Allergies: No Known Allergies    Medication List       This list is accurate as of: 11/24/12 11:59 PM.  Always use  your most recent med list.               ARMOUR THYROID 180 MG tablet  Generic drug:  thyroid  Take 170 mg by mouth daily. Compounded dose of 170 mg     CALCIUM & MAGNESIUM CARBONATES PO  Take 1 tablet by mouth daily.     CAYENNE PEPPER PO  Take 1 tablet by mouth daily as needed. For arthritis     cetirizine 10 MG tablet  Commonly known as:  ZYRTEC  Take 10 mg by mouth daily.     HYDROcodone-acetaminophen 10-325 MG per tablet  Commonly known as:  NORCO  Take 1 tablet by mouth every 6 (six) hours as needed for pain.     ibuprofen 600 MG tablet  Commonly known as:  ADVIL,MOTRIN  Take 1 tablet (600 mg total) by mouth every 6 (six) hours as needed for  pain.     levothyroxine 112 MCG tablet  Commonly known as:  SYNTHROID, LEVOTHROID  Take 2 tablets by mouth once a day on an empty stomach     Melatonin 5 MG Tabs  Take 5-10 mg by mouth at bedtime.     methocarbamol 500 MG tablet  Commonly known as:  ROBAXIN  Take 500 mg by mouth 4 (four) times daily.     NON FORMULARY  1 capsule. Flax seed  Or krill oil capsules - 1 per day     pseudoephedrine 30 MG tablet  Commonly known as:  SUDAFED  Take 30 mg by mouth daily as needed. For sinus congestion     traMADol 50 MG tablet  Commonly known as:  ULTRAM  Take 50 mg by mouth every 6 (six) hours as needed for pain.        Review of Systems:      ENDOCRINOLOGY:  no history of Diabetes.        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   She is subjectively better with using a compounded preparation of Armour Thyroid 170 mg which was equivalent to the 224 mcg of Synthroid Her TSH has been about the same with either preparation, free T4 is relatively low as expected  PLAN: Since she is subjectively doing well in her TSH is excellent will continue the same dose  Mikayla Crawford 11/25/2012, 4:44 PM

## 2012-11-25 NOTE — Patient Instructions (Signed)
Continue same dose

## 2013-05-22 ENCOUNTER — Other Ambulatory Visit: Payer: Self-pay

## 2013-05-25 ENCOUNTER — Telehealth: Payer: Self-pay | Admitting: Endocrinology

## 2013-05-25 ENCOUNTER — Ambulatory Visit: Payer: Self-pay | Admitting: Endocrinology

## 2013-05-25 NOTE — Telephone Encounter (Signed)
We do not do this here, needs to be done with PCP

## 2013-05-25 NOTE — Telephone Encounter (Signed)
Noted, patient is aware. 

## 2013-05-25 NOTE — Telephone Encounter (Signed)
Please see below and advise.

## 2013-05-25 NOTE — Telephone Encounter (Signed)
Pt has labs tomorrow and would like to see if we can add a food allergy panel to her labs

## 2013-05-26 ENCOUNTER — Other Ambulatory Visit (INDEPENDENT_AMBULATORY_CARE_PROVIDER_SITE_OTHER): Payer: Self-pay

## 2013-05-26 DIAGNOSIS — E890 Postprocedural hypothyroidism: Secondary | ICD-10-CM

## 2013-05-26 LAB — TSH: TSH: 1.06 u[IU]/mL (ref 0.35–5.50)

## 2013-05-31 ENCOUNTER — Encounter: Payer: Self-pay | Admitting: Endocrinology

## 2013-05-31 ENCOUNTER — Ambulatory Visit (INDEPENDENT_AMBULATORY_CARE_PROVIDER_SITE_OTHER): Payer: 59 | Admitting: Endocrinology

## 2013-05-31 VITALS — BP 104/76 | HR 90 | Temp 98.8°F | Resp 12 | Wt 233.8 lb

## 2013-05-31 DIAGNOSIS — E890 Postprocedural hypothyroidism: Secondary | ICD-10-CM

## 2013-05-31 NOTE — Progress Notes (Signed)
Patient ID: Mikayla FlackLois R Crawford, female   DOB: 09/08/1965, 48 y.o.   MRN: [id number]    Reason for Appointment:  Hypothyroidism, new visit   History of Present Illness:   She has history of hypothyroidism since 2009.  Past history: At diagnosis she was having symptoms like weight gain, hoarseness, carpal tunnel syndrome, fatigue and hair loss. Further details or baseline lab records are not available. She was then started on levothyroxine supplements. With treatment she did not feel better and then went to a holistic practitioner who put her on T3 and T4 separately.  She underwent total thyroidectomy on February 20, 2009 for multinodular goiter because of symptoms of hoarseness, choking and local pressure. Final pathology report was benign.  She was subsequently on levothyroxine 150 rncg a day. With this her thyroid levels were normal in 2012, TSH 2.3. She was tried on a regimen of 120 mg Armour thyroid but she didn't feel any better with this On 07/03/11: Was given a combination of 25 mcg of Cytomel and 150 of levothyroxine which was changed to levothyroxine 225 mcg on 03/25/12  She had complaints of feeling tired and her TSH was found to be 145 in early 7/14. Most likely this was from a dispensing issue at the pharmacy  RECENT history: With taking 224 mcg of Synthroid she was still having some fatigue She previously had felt better with taking a custom-made preparation of Armour Thyroid and wanted to change She was switched to a compounded preparation of 170 mg of Armour Thyroid at the end of 8/14 With this she felt that her energy level improved However again she is feeling somewhat tired but also is having some sleep issues She does not complain of any hair loss, cold intolerance, puffiness of hands or feet or weight gain. She had been compliant with taking her medication daily and does not take any other medications or OTC products for at least 2 hours after this She is trying to use a dispensing  cup to make sure she is taking her medication  Lab Results  Component Value Date   FREET4 0.58* 11/21/2012   FREET4 1.45 09/21/2012   FREET4 0.17* 08/22/2012   TSH 1.06 05/26/2013   TSH 1.21 11/21/2012   TSH 0.63 09/21/2012      Past Medical History  Diagnosis Date  . Thyroid disease     no thyroid  . Hashimoto's disease   . PONV (postoperative nausea and vomiting)   . Seasonal allergies   . GERD (gastroesophageal reflux disease)     tums prn for occas reflux  . Recurrent cold sores     Past Surgical History  Procedure Laterality Date  . Carpal tunnel release  2009  . Thyroidectomy  2009  . Tonsillectomy  1988  . Thyroidectomy  1/12  . Laparoscopic tubal ligation  02/25/2012    Procedure: LAPAROSCOPIC TUBAL LIGATION;  Surgeon: Catalina AntiguaPeggy Constant, MD;  Location: [facility];  Service: Gynecology;  Laterality: Bilateral;    Family History  Problem Relation Age of Onset  . Macular degeneration Mother   . Stroke Mother   . Glaucoma Mother   . Heart disease Mother   . Heart disease Father   . Diabetes Father   . Macular degeneration Father   . Crohn's disease Daughter   . Thyroid disease Daughter   . Diabetes Brother   . Cancer Maternal Grandmother     Social History:  reports that she has never smoked. She has never used smokeless  tobacco. She reports that she does not drink alcohol or use illicit drugs.  Allergies: No Known Allergies    Medication List       This list is accurate as of: 05/31/13  8:39 AM.  Always use your most recent med list.               acyclovir 800 MG tablet  Commonly known as:  ZOVIRAX  Take 800 mg by mouth daily.     ARMOUR THYROID 180 MG tablet  Generic drug:  thyroid  Take 170 mg by mouth daily. Compounded dose of 170 mg     CALCIUM & MAGNESIUM CARBONATES PO  Take 1 tablet by mouth daily.     CAYENNE PEPPER PO  Take 1 tablet by mouth daily as needed. For arthritis     cetirizine 10 MG tablet  Commonly known as:  ZYRTEC  Take 10  mg by mouth daily.     HYDROcodone-acetaminophen 10-325 MG per tablet  Commonly known as:  NORCO  Take 1 tablet by mouth every 6 (six) hours as needed for pain.     ibuprofen 600 MG tablet  Commonly known as:  ADVIL,MOTRIN  Take 1 tablet (600 mg total) by mouth every 6 (six) hours as needed for pain.     Melatonin 5 MG Tabs  Take 5-10 mg by mouth at bedtime.     methocarbamol 500 MG tablet  Commonly known as:  ROBAXIN  Take 500 mg by mouth 4 (four) times daily.     NON FORMULARY  1 capsule. Flax seed  Or krill oil capsules - 1 per day     pseudoephedrine 30 MG tablet  Commonly known as:  SUDAFED  Take 30 mg by mouth daily as needed. For sinus congestion     sulfamethoxazole-trimethoprim 800-160 MG per tablet  Commonly known as:  BACTRIM DS  Take 1 tablet by mouth daily.     traMADol 50 MG tablet  Commonly known as:  ULTRAM  Take 50 mg by mouth every 6 (six) hours as needed for pain.        Review of Systems:      ENDOCRINOLOGY:  no history of Diabetes.     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   She is subjectively better with using a compounded preparation of Armour Thyroid 170 mg which was equivalent to the 224 mcg of Synthroid Her TSH has been about the same consistently now   PLAN: Since she is subjectively doing fairly well and her TSH is excellent will continue the same dose of compounded medication and followup in 6 months  Percy Comp 05/31/2013, 8:39 AM

## 2013-06-21 ENCOUNTER — Other Ambulatory Visit (HOSPITAL_COMMUNITY): Payer: Self-pay | Admitting: Internal Medicine

## 2013-06-21 DIAGNOSIS — Z1231 Encounter for screening mammogram for malignant neoplasm of breast: Secondary | ICD-10-CM

## 2013-07-04 ENCOUNTER — Ambulatory Visit (HOSPITAL_COMMUNITY)
Admission: RE | Admit: 2013-07-04 | Discharge: 2013-07-04 | Disposition: A | Discharge status: Home / Self Care | Payer: 59 | Source: Ambulatory Visit | Attending: Internal Medicine | Admitting: Internal Medicine

## 2013-07-04 DIAGNOSIS — Z803 Family history of malignant neoplasm of breast: Secondary | ICD-10-CM | POA: Insufficient documentation

## 2013-07-04 DIAGNOSIS — Z1231 Encounter for screening mammogram for malignant neoplasm of breast: Secondary | ICD-10-CM

## 2013-10-03 ENCOUNTER — Encounter: Payer: Self-pay | Admitting: Endocrinology

## 2013-11-27 ENCOUNTER — Encounter: Payer: Self-pay | Admitting: Endocrinology

## 2013-11-27 ENCOUNTER — Other Ambulatory Visit: Payer: 59

## 2013-12-01 ENCOUNTER — Encounter: Payer: Self-pay | Admitting: *Deleted

## 2013-12-01 ENCOUNTER — Telehealth: Payer: Self-pay | Admitting: Endocrinology

## 2013-12-01 ENCOUNTER — Ambulatory Visit: Payer: 59 | Admitting: Endocrinology

## 2013-12-01 DIAGNOSIS — Z0289 Encounter for other administrative examinations: Secondary | ICD-10-CM

## 2013-12-01 NOTE — Telephone Encounter (Signed)
Patient no showed today's appt. Please advise on how to follow up. °A. No follow up necessary. °B. Follow up urgent. Contact patient immediately. °C. Follow up necessary. Contact patient and schedule visit in ___ days. °D. Follow up advised. Contact patient and schedule visit in ____weeks. ° °

## 2013-12-01 NOTE — Telephone Encounter (Signed)
Follow up necessary. Contact patient and schedule asap  

## 2013-12-05 NOTE — Telephone Encounter (Signed)
LM for pt to call back to schedule appt.

## 2014-01-05 NOTE — Telephone Encounter (Signed)
Have not received callback to schedule in over one month-closing encounter.

## 2014-07-25 ENCOUNTER — Ambulatory Visit (INDEPENDENT_AMBULATORY_CARE_PROVIDER_SITE_OTHER): Payer: 59 | Admitting: Neurology

## 2014-07-25 ENCOUNTER — Encounter: Payer: Self-pay | Admitting: Neurology

## 2014-07-25 VITALS — BP 124/76 | HR 78 | Resp 16 | Ht 65.5 in | Wt 231.4 lb

## 2014-07-25 DIAGNOSIS — H9313 Tinnitus, bilateral: Secondary | ICD-10-CM | POA: Diagnosis not present

## 2014-07-25 DIAGNOSIS — G4489 Other headache syndrome: Secondary | ICD-10-CM | POA: Diagnosis not present

## 2014-07-25 DIAGNOSIS — H918X9 Other specified hearing loss, unspecified ear: Secondary | ICD-10-CM | POA: Insufficient documentation

## 2014-07-25 DIAGNOSIS — H9319 Tinnitus, unspecified ear: Secondary | ICD-10-CM | POA: Insufficient documentation

## 2014-07-25 DIAGNOSIS — G4700 Insomnia, unspecified: Secondary | ICD-10-CM | POA: Diagnosis not present

## 2014-07-25 MED ORDER — DIAZEPAM 5 MG PO TABS: 5.0000 mg | ORAL_TABLET | Freq: Four times a day (QID) | ORAL | Status: DC | PRN

## 2014-07-25 NOTE — Progress Notes (Addendum)
[facility]  PATIENT: Mikayla Crawford DOB: Feb 19, 1965  REFERRING DOCTOR OR PCP:  Quitman Livings PCP.  Referred by Dr. Aletta Edouard (fax [phone]) SOURCE: patient, records form Dr. Conley Rolls   _________________________________   HISTORICAL  CHIEF COMPLAINT:  Chief Complaint  Patient presents with  . Auditory Disturbance    Sts. on 06-17-14, she had dental work, and since then, she's heard a high frequency sound, both ears.  Worse at night.  It is getting some better, as sound is now intermittent rather than constant./fim    HISTORY OF PRESENT ILLNESS:  I had the pleasure of seeing your patient, Mikayla Crawford for a neurologic consultation at [facility] regarding her headaches andauditory disturbances.     She began to notice a high frequency ringing sensation in her ears about 4 weeks ago she noted a few days earlier she had dental surgery. Then about the time she noted it more she also had a GI virus. Initially the high frequency ringing was more intense. It is continuing but is a little better than it was a couple weeks ago. She notes it is bilateral. She notes the ringing more at nighttime. She is playing white noise to help her fall asleep.she notes mild hearing loss on the left that seems to be recent.  She has not noted any trouble with her balance.  More recently, her headaches have been worse. She describes a current like sensation that is bilateral. When the headaches are more intense they are associated with nausea but not vomiting. She will get some photophobia and phonophobia. Moving somewhat increases the pain and laying down would decrease the pain. She uses over-the-counter analgesics and sinus medications, sometimes with benefit.  She notes combination of sleep onset and sleep maintenance insomnia. She takes melatonin and Benadryl, sometimes with benefit. The insomnia seems to be doing worse more recently with a combination of her father dying about 5  or 6 weeks ago and the tinnitus.  She recently had an eye exam on 07/12/2014 and it showed mild cataracts and was otherwise essentially normal.  She has noted some depression since her father's death last month.   REVIEW OF SYSTEMS: Constitutional: No fevers, chills, sweats, or change in appetite Eyes: No visual changes, double vision, eye pain Ear, nose and throat: No hearing loss, ear pain, nasal congestion, sore throat Cardiovascular: No chest pain, palpitations Respiratory: No shortness of breath at rest or with exertion.   No wheezes GastrointestinaI: No nausea, vomiting, diarrhea, abdominal pain, fecal incontinence Genitourinary: No dysuria, urinary retention or frequency.  No nocturia. Musculoskeletal: No neck pain, back pain Integumentary: No rash, pruritus, skin lesions Neurological: as above Psychiatric: No depression at this time.  No anxiety Endocrine: No palpitations, diaphoresis, change in appetite, change in weigh or increased thirst Hematologic/Lymphatic: No anemia, purpura, petechiae. Allergic/Immunologic: No itchy/runny eyes, nasal congestion, recent allergic reactions, rashes  ALLERGIES: No Known Allergies  HOME MEDICATIONS:  Current outpatient prescriptions:  .  acyclovir (ZOVIRAX) 800 MG tablet, Take 800 mg by mouth daily., Disp: , Rfl:  .  cetirizine (ZYRTEC) 10 MG tablet, Take 10 mg by mouth daily., Disp: , Rfl:  .  Melatonin 5 MG TABS, Take 5-10 mg by mouth at bedtime., Disp: , Rfl:  .  pseudoephedrine (SUDAFED) 30 MG tablet, Take 30 mg by mouth daily as needed. For sinus congestion, Disp: , Rfl:  .  thyroid (ARMOUR THYROID) 180 MG tablet, Take 170 mg by mouth daily. Compounded dose of 170  mg, Disp: , Rfl:  .  CALCIUM & MAGNESIUM CARBONATES PO, Take 1 tablet by mouth daily. , Disp: , Rfl:  .  Capsicum, Cayenne, (CAYENNE PEPPER PO), Take 1 tablet by mouth daily as needed. For arthritis, Disp: , Rfl:  .  HYDROcodone-acetaminophen (NORCO) 10-325 MG per  tablet, Take 1 tablet by mouth every 6 (six) hours as needed for pain., Disp: , Rfl:  .  ibuprofen (ADVIL,MOTRIN) 600 MG tablet, Take 1 tablet (600 mg total) by mouth every 6 (six) hours as needed for pain. (Patient not taking: Reported on 07/25/2014), Disp: 30 tablet, Rfl: 1 .  methocarbamol (ROBAXIN) 500 MG tablet, Take 500 mg by mouth 4 (four) times daily., Disp: , Rfl:  .  NON FORMULARY, 1 capsule. Flax seed  Or krill oil capsules - 1 per day, Disp: , Rfl:  .  sulfamethoxazole-trimethoprim (BACTRIM DS) 800-160 MG per tablet, Take 1 tablet by mouth daily., Disp: , Rfl:  .  traMADol (ULTRAM) 50 MG tablet, Take 50 mg by mouth every 6 (six) hours as needed for pain., Disp: , Rfl:   PAST MEDICAL HISTORY: Past Medical History  Diagnosis Date  . Thyroid disease     no thyroid  . Hashimoto's disease   . PONV (postoperative nausea and vomiting)   . Seasonal allergies   . GERD (gastroesophageal reflux disease)     tums prn for occas reflux  . Recurrent cold sores   . Vision abnormalities     PAST SURGICAL HISTORY: Past Surgical History  Procedure Laterality Date  . Carpal tunnel release  2009  . Thyroidectomy  2009  . Tonsillectomy  1988  . Thyroidectomy  1/12  . Laparoscopic tubal ligation  02/25/2012    Procedure: LAPAROSCOPIC TUBAL LIGATION;  Surgeon: Catalina AntiguaPeggy Constant, MD;  Location: [facility];  Service: Gynecology;  Laterality: Bilateral;    FAMILY HISTORY: Family History  Problem Relation Age of Onset  . Macular degeneration Mother   . Stroke Mother   . Glaucoma Mother   . Heart disease Mother   . Heart disease Father   . Diabetes Father   . Macular degeneration Father   . Crohn's disease Daughter   . Thyroid disease Daughter   . Diabetes Brother   . Cancer Maternal Grandmother     SOCIAL HISTORY:  History   Social History  . Marital Status: Single    Spouse Name: N/A  . Number of Children: N/A  . Years of Education: N/A   Occupational History  . Not on file.    Social History Main Topics  . Smoking status: Never Smoker   . Smokeless tobacco: Never Used  . Alcohol Use: No  . Drug Use: No  . Sexual Activity: Not Currently    Birth Control/ Protection: Condom   Other Topics Concern  . Not on file   Social History Narrative     PHYSICAL EXAM  Filed Vitals:   07/25/14 1447  BP: 124/76  Pulse: 78  Resp: 16  Height: 5' 5.5" (1.664 m)  Weight: 231 lb 6.4 oz (104.962 kg)    Body mass index is 37.91 kg/(m^2).   General: The patient is well-developed and well-nourished and in no acute distress  Ears/Eyes:  Tympanic membranes are intact. Now is appeared normal and there was no significant wax buildup   Mild cataracts OU.  unduscopic exam shows normal optic discs and retinal vessels.  Neck: The neck is supple, no carotid bruits are noted.  The neck is nontender.  Cardiovascular: The heart has a regular rate and rhythm with a normal S1 and S2. There were no murmurs, gallops or rubs.    Skin: Extremities are without significant edema.  Musculoskeletal:  Back is nontender  Neurologic Exam  Mental status: The patient is alert and oriented x 3 at the time of the examination. The patient has apparent normal recent and remote memory, with an apparently normal attention span and concentration ability.   Speech is normal.  Cranial nerves: Extraocular movements are full. Pupils are equal, round, and reactive to light and accomodation.  Visual fields are full.  Facial symmetry is present. There is good facial sensation to soft touch bilaterally.Facial strength is normal.  Trapezius and sternocleidomastoid strength is normal. No dysarthria is noted.  The tongue is midline, and the patient has symmetric elevation of the soft palate. She notes mild hearing asymmetry, slightly louder on the left. The Weber sign slightly lateralized to the left.  Motor:  Muscle bulk is normal.   Tone is normal. Strength is  5 / 5 in all 4 extremities.   Sensory:  Sensory testing is intact to soft touch and vibration sensation in all 4 extremities.  Coordination: Cerebellar testing reveals good finger-nose-finger and heel-to-shin bilaterally.  Gait and station: Station is normal.   Gait is normal. Tandem gait is normal.   Reflexes: Deep tendon reflexes are symmetric and normal bilaterally.       DIAGNOSTIC DATA (LABS, IMAGING, TESTING) - I reviewed patient records, labs, notes, testing and imaging myself where available.      ASSESSMENT AND PLAN   Tinnitus, bilateral  Asymmetrical hearing loss, unspecified laterality  Other headache syndrome  Insomnia  In summary, Mikayla Crawford is a 11 old woman who reports bilateral tinnitus with mild hearing loss who also has a history of headaches that have been worse the last 5 weeks. Additionally, she has insomnia that has been worse over the past 5 weeks. I am not sure what is causing her tinnitus. Because of the asymmetry in her hearing we need to rule out an intracranial process such as an Acoustic Schwannoma and we will check an MRI of the brain with and without contrast with added cuts through the internal auditory canals. She should continue using white noise at night as that is often beneficial for tinnitus. Since it is not helping her enough, I will also add diazepam at bedtime as it is sometimes beneficial for tinnitus.     If symptoms do not further the next month, I will refer her to ENT for further evaluation. Hopefully her headaches will improve if she is sleeping better.  She also notes more mood disturbance since the death of her father. If this does not improve over the next couple months, I would consider either adding an antidepressant or referring her to psychology for counseling.  She will return to see me in 2 months or sooner if she has new or worsening neurologic symptoms. Mikayla Crawford A. Epimenio Foot, MD, PhD 07/25/2014, 2:59 PM Certified in Neurology, Clinical Neurophysiology, Sleep Medicine,  Pain Medicine and Neuroimaging  [facility] [address], [address] [place], [place] [address] [phone]

## 2014-07-25 NOTE — Patient Instructions (Signed)
Valium as needed at bedtime for the tinnitus and insomnia We will schedule an MRI of the brain If symptoms are not any better in a few weeks give us a call and we will set up an evaluation by ENT.

## 2014-10-02 ENCOUNTER — Ambulatory Visit: Payer: 59 | Admitting: Neurology

## 2014-12-15 ENCOUNTER — Encounter (HOSPITAL_COMMUNITY): Payer: Self-pay | Admitting: Emergency Medicine

## 2014-12-15 ENCOUNTER — Emergency Department (HOSPITAL_COMMUNITY)
Admission: EM | Admit: 2014-12-15 | Discharge: 2014-12-15 | Disposition: A | Discharge status: Home / Self Care | Payer: Self-pay | Attending: Emergency Medicine | Admitting: Emergency Medicine

## 2014-12-15 DIAGNOSIS — E063 Autoimmune thyroiditis: Secondary | ICD-10-CM | POA: Insufficient documentation

## 2014-12-15 DIAGNOSIS — E039 Hypothyroidism, unspecified: Secondary | ICD-10-CM | POA: Insufficient documentation

## 2014-12-15 DIAGNOSIS — Z8719 Personal history of other diseases of the digestive system: Secondary | ICD-10-CM | POA: Insufficient documentation

## 2014-12-15 DIAGNOSIS — Z8669 Personal history of other diseases of the nervous system and sense organs: Secondary | ICD-10-CM | POA: Insufficient documentation

## 2014-12-15 DIAGNOSIS — R42 Dizziness and giddiness: Secondary | ICD-10-CM | POA: Insufficient documentation

## 2014-12-15 DIAGNOSIS — Z79899 Other long term (current) drug therapy: Secondary | ICD-10-CM | POA: Insufficient documentation

## 2014-12-15 DIAGNOSIS — R110 Nausea: Secondary | ICD-10-CM | POA: Insufficient documentation

## 2014-12-15 DIAGNOSIS — Z8709 Personal history of other diseases of the respiratory system: Secondary | ICD-10-CM | POA: Insufficient documentation

## 2014-12-15 LAB — CBC WITH DIFFERENTIAL/PLATELET
Basophils Absolute: 0.1 10*3/uL (ref 0.0–0.1)
Basophils Relative: 1 %
EOS PCT: 3 %
Eosinophils Absolute: 0.2 10*3/uL (ref 0.0–0.7)
HEMATOCRIT: 33.4 % — AB (ref 36.0–46.0)
Hemoglobin: 10.1 g/dL — ABNORMAL LOW (ref 12.0–15.0)
LYMPHS ABS: 1.9 10*3/uL (ref 0.7–4.0)
Lymphocytes Relative: 34 %
MCH: 21.6 pg — AB (ref 26.0–34.0)
MCHC: 30.2 g/dL (ref 30.0–36.0)
MCV: 71.5 fL — AB (ref 78.0–100.0)
MONO ABS: 0.3 10*3/uL (ref 0.1–1.0)
Monocytes Relative: 5 %
NEUTROS ABS: 3.1 10*3/uL (ref 1.7–7.7)
Neutrophils Relative %: 57 %
PLATELETS: 285 10*3/uL (ref 150–400)
RBC: 4.67 MIL/uL (ref 3.87–5.11)
RDW: 20 % — ABNORMAL HIGH (ref 11.5–15.5)
WBC: 5.6 10*3/uL (ref 4.0–10.5)

## 2014-12-15 LAB — BASIC METABOLIC PANEL
Anion gap: 8 (ref 5–15)
BUN: 15 mg/dL (ref 6–20)
CHLORIDE: 107 mmol/L (ref 101–111)
CO2: 22 mmol/L (ref 22–32)
CREATININE: 0.96 mg/dL (ref 0.44–1.00)
Calcium: 8.6 mg/dL — ABNORMAL LOW (ref 8.9–10.3)
GFR calc Af Amer: >60 mL/min (ref >60)
GLUCOSE: 96 mg/dL (ref 65–99)
Potassium: 3.5 mmol/L (ref 3.5–5.1)
SODIUM: 137 mmol/L (ref 135–145)

## 2014-12-15 MED ORDER — THYROID 180 MG PO TABS: 170.0000 mg | ORAL_TABLET | Freq: Every day | ORAL | Status: AC

## 2014-12-15 NOTE — ED Provider Notes (Signed)
CSN: [id number]     Arrival date & time 12/15/14  1023 History   First MD Initiated Contact with Patient 12/15/14 1038     Chief Complaint  Patient presents with  . Dizziness     (Consider location/radiation/quality/duration/timing/severity/associated sxs/prior Treatment) Patient is a 49 y.o. female presenting with dizziness. The history is provided by the patient.  Dizziness Quality:  Lightheadedness Associated symptoms: nausea and weakness   Associated symptoms: no chest pain and no shortness of breath    patient with history of Hashimoto's thyroiditis. Patient has been out of her thyroid medicine for 2-1/2 weeks. Patient starting to feel fatigued having some nausea no vomiting lightheadedness and dizziness no true vertigo. As well as generalized weakness. No focal deficits. Patient particular feels lightheaded when she has to get up on ladders at work. Patient denies any chest pain shortness of breath headache abdominal pain. She had time to feel like she's got a pass out but she has not has syncopal episode.  Past Medical History  Diagnosis Date  . Thyroid disease     no thyroid  . Hashimoto's disease   . PONV (postoperative nausea and vomiting)   . Seasonal allergies   . GERD (gastroesophageal reflux disease)     tums prn for occas reflux  . Recurrent cold sores   . Vision abnormalities    Past Surgical History  Procedure Laterality Date  . Carpal tunnel release  2009  . Thyroidectomy  2009  . Tonsillectomy  1988  . Thyroidectomy  1/12  . Laparoscopic tubal ligation  02/25/2012    Procedure: LAPAROSCOPIC TUBAL LIGATION;  Surgeon: Catalina Antigua, MD;  Location: [facility];  Service: Gynecology;  Laterality: Bilateral;   Family History  Problem Relation Age of Onset  . Macular degeneration Mother   . Stroke Mother   . Glaucoma Mother   . Heart disease Mother   . Heart disease Father   . Diabetes Father   . Macular degeneration Father   . Crohn's disease Daughter   .  Thyroid disease Daughter   . Diabetes Brother   . Cancer Maternal Grandmother    Social History  Substance Use Topics  . Smoking status: Never Smoker   . Smokeless tobacco: Never Used  . Alcohol Use: No   OB History    Gravida Para Term Preterm AB TAB SAB Ectopic Multiple Living   0 1 1 0 0 0 1     Review of Systems  Constitutional: Positive for fatigue. Negative for fever.  HENT: Negative for congestion.   Eyes: Negative for visual disturbance.  Respiratory: Negative for shortness of breath.   Cardiovascular: Negative for chest pain.  Gastrointestinal: Positive for nausea. Negative for abdominal pain.  Genitourinary: Negative for dysuria.  Musculoskeletal: Negative for myalgias.  Skin: Negative for rash.  Neurological: Positive for dizziness, weakness and light-headedness. Negative for tremors, speech difficulty and numbness.  Hematological: Does not bruise/bleed easily.  Psychiatric/Behavioral: Negative for behavioral problems.      Allergies  Review of patient's allergies indicates no known allergies.  Home Medications   Prior to Admission medications   Medication Sig Start Date End Date Taking? Authorizing Provider  cetirizine (ZYRTEC) 10 MG tablet Take 10 mg by mouth daily as needed for allergies.    Yes Historical Provider, MD  diphenhydrAMINE (BENADRYL) 25 mg capsule Take 25 mg by mouth at bedtime as needed for sleep.   Yes Historical Provider, MD  ibuprofen (ADVIL,MOTRIN) 200 MG tablet Take 800  mg by mouth every 8 (eight) hours as needed for mild pain or moderate pain.   Yes Historical Provider, MD  thyroid (ARMOUR THYROID) 180 MG tablet Take 170 mg by mouth daily. Compounded dose of 170 mg   Yes Historical Provider, MD  thyroid (ARMOUR THYROID) 180 MG tablet Take 1 tablet (180 mg total) by mouth daily. 12/15/14   Vanetta MuldersScott Shamira Toutant, MD   BP 106/69 mmHg  Pulse 85  Temp(Src) 97.6 F (36.4 C) (Oral)  Resp 18  Ht 5' 5.5" (1.664 m)  Wt 203 lb (92.08 kg)  BMI  33.26 kg/m2  SpO2 100%  LMP 12/15/2014 Physical Exam  Constitutional: She is oriented to person, place, and time. She appears well-developed and well-nourished. No distress.  HENT:  Head: Normocephalic and atraumatic.  Mouth/Throat: Oropharynx is clear and moist.  Eyes: Conjunctivae and EOM are normal. Pupils are equal, round, and reactive to light.  Neck: Normal range of motion. Neck supple. No tracheal deviation present. No thyromegaly present.  Cardiovascular: Normal rate, regular rhythm and normal heart sounds.   No murmur heard. Pulmonary/Chest: Effort normal and breath sounds normal. No respiratory distress.  Abdominal: Soft. Bowel sounds are normal. There is no tenderness.  Musculoskeletal: Normal range of motion.  Lymphadenopathy:    She has no cervical adenopathy.  Neurological: She is alert and oriented to person, place, and time. No cranial nerve deficit. She exhibits normal muscle tone. Coordination normal.  Skin: Skin is warm. No rash noted.  Nursing note and vitals reviewed.   ED Course  Procedures (including critical care time) Labs Review Labs Reviewed  BASIC METABOLIC PANEL - Abnormal; Notable for the following:    Calcium 8.6 (*)    All other components within normal limits  CBC WITH DIFFERENTIAL/PLATELET - Abnormal; Notable for the following:    Hemoglobin 10.1 (*)    HCT 33.4 (*)    MCV 71.5 (*)    MCH 21.6 (*)    RDW 20.0 (*)    All other components within normal limits   Results for orders placed or performed during the hospital encounter of 12/15/14  Basic metabolic panel  Result Value Ref Range   Sodium 137 135 - 145 mmol/L   Potassium 3.5 3.5 - 5.1 mmol/L   Chloride 107 101 - 111 mmol/L   CO2 22 22 - 32 mmol/L   Glucose, Bld 96 65 - 99 mg/dL   BUN 15 6 - 20 mg/dL   Creatinine, Ser 2.950.96 0.44 - 1.00 mg/dL   Calcium 8.6 (L) 8.9 - 10.3 mg/dL   GFR calc non Af Amer >60 >60 mL/min   GFR calc Af Amer >60 >60 mL/min   Anion gap 8 5 - 15  CBC with  Differential/Platelet  Result Value Ref Range   WBC 5.6 4.0 - 10.5 K/uL   RBC 4.67 3.87 - 5.11 MIL/uL   Hemoglobin 10.1 (L) 12.0 - 15.0 g/dL   HCT 62.133.4 (L) 30.836.0 - 65.746.0 %   MCV 71.5 (L) 78.0 - 100.0 fL   MCH 21.6 (L) 26.0 - 34.0 pg   MCHC 30.2 30.0 - 36.0 g/dL   RDW 84.620.0 (H) 96.211.5 - 95.215.5 %   Platelets 285 150 - 400 K/uL   Neutrophils Relative % 57 %   Lymphocytes Relative 34 %   Monocytes Relative 5 %   Eosinophils Relative 3 %   Basophils Relative 1 %   Neutro Abs 3.1 1.7 - 7.7 K/uL   Lymphs Abs 1.9 0.7 - 4.0  K/uL   Monocytes Absolute 0.3 0.1 - 1.0 K/uL   Eosinophils Absolute 0.2 0.0 - 0.7 K/uL   Basophils Absolute 0.1 0.0 - 0.1 K/uL   WBC Morphology TOXIC GRANULATION      Imaging Review No results found. I have personally reviewed and evaluated these images and lab results as part of my medical decision-making.   EKG Interpretation   Date/Time:  Saturday December 15 2014 12:01:10 EST Ventricular Rate:  80 PR Interval:  176 QRS Duration: 88 QT Interval:  388 QTC Calculation: 448 R Axis:   15 Text Interpretation:  Sinus rhythm Confirmed by Jewel Venditto  MD, Elver Stadler  [phone]) on 12/15/2014 12:07:18 PM      MDM   Final diagnoses:  Dizziness  Hypothyroidism, unspecified hypothyroidism type    Patient with a history of Hashimoto's thyroiditis. However thyroid medication for 2-1/2 weeks. Her regular doctor will not phone in a prescription. Right now she doesn't have enough money to have an office visit. At work she's been feeling lightheaded particularly upon ladders. She has had generalized nausea no vomiting generalized weakness and fatigue no true vertigo no headache no chest pain no shortness of breath no neuro focal deficits.  Patient's workup here CBC and basic metabolic panel without significant abnormalities. EKG without acute findings.  Will give patient prescription of her thyroid medicine to hold her over until she can see her regular doctor. Patient is switching  jobs.    Vanetta Mulders, MD 12/15/14 1446

## 2014-12-15 NOTE — Discharge Instructions (Signed)
Labs without any sniffing abnormality. Return for any new or worse symptoms particular if you start with true vertigo which is room spinning. Make an appointment to follow-up with your regular doctor. Restart your thyroid medication. Prescription provided. Work note provided.

## 2014-12-15 NOTE — ED Notes (Signed)
Pt verbalized understanding of d/c instructions, prescriptions, and follow-up care. No further questions/concerns, VSS, ambulatory w/ steady gait (refused wheelchair) 

## 2014-12-15 NOTE — ED Notes (Signed)
Pt c/o dizziness ongoing for past few days increasing today. Pt has not had her thyroid medication for a 2 1/2 weeks. Pt c/o nausea, generalized weakness and fatigue.

## 2017-03-13 ENCOUNTER — Other Ambulatory Visit: Payer: Self-pay

## 2017-03-13 ENCOUNTER — Emergency Department
Admission: EM | Admit: 2017-03-13 | Discharge: 2017-03-13 | Disposition: A | Discharge status: Home / Self Care | Payer: 59 | Attending: Emergency Medicine | Admitting: Emergency Medicine

## 2017-03-13 ENCOUNTER — Encounter: Payer: Self-pay | Admitting: Emergency Medicine

## 2017-03-13 ENCOUNTER — Emergency Department: Payer: 59

## 2017-03-13 DIAGNOSIS — F0781 Postconcussional syndrome: Secondary | ICD-10-CM | POA: Insufficient documentation

## 2017-03-13 DIAGNOSIS — G44319 Acute post-traumatic headache, not intractable: Secondary | ICD-10-CM | POA: Diagnosis not present

## 2017-03-13 DIAGNOSIS — R519 Headache, unspecified: Secondary | ICD-10-CM

## 2017-03-13 DIAGNOSIS — R51 Headache: Secondary | ICD-10-CM | POA: Diagnosis present

## 2017-03-13 DIAGNOSIS — M7918 Myalgia, other site: Secondary | ICD-10-CM

## 2017-03-13 MED ORDER — TRAMADOL HCL 50 MG PO TABS: 50.0000 mg | ORAL_TABLET | Freq: Four times a day (QID) | ORAL | 0 refills | Status: AC | PRN

## 2017-03-13 MED ORDER — BUTALBITAL-APAP-CAFFEINE 50-325-40 MG PO TABS: 1.0000 | ORAL_TABLET | Freq: Four times a day (QID) | ORAL | 0 refills | Status: AC | PRN

## 2017-03-13 MED ORDER — KETOROLAC TROMETHAMINE 60 MG/2ML IM SOLN
60.0000 mg | Freq: Once | INTRAMUSCULAR | Status: AC
  Administered 2017-03-13: 60 mg via INTRAMUSCULAR
  Filled 2017-03-13: qty 2

## 2017-03-13 MED ORDER — BUTALBITAL-APAP-CAFFEINE 50-325-40 MG PO TABS
2.0000 | ORAL_TABLET | Freq: Once | ORAL | Status: AC
  Administered 2017-03-13: 2 via ORAL
  Filled 2017-03-13: qty 2
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
## 2017-03-13 NOTE — Discharge Instructions (Signed)
Please follow up with your primary care physician for further evaluation of your concussive symptoms
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
## 2017-03-26 ENCOUNTER — Emergency Department
Admission: EM | Admit: 2017-03-26 | Discharge: 2017-03-26 | Disposition: A | Discharge status: Home / Self Care | Payer: 59 | Attending: Emergency Medicine | Admitting: Emergency Medicine

## 2017-03-26 ENCOUNTER — Other Ambulatory Visit: Payer: Self-pay

## 2017-03-26 ENCOUNTER — Encounter: Payer: Self-pay | Admitting: Emergency Medicine

## 2017-03-26 DIAGNOSIS — R51 Headache: Secondary | ICD-10-CM | POA: Diagnosis not present

## 2017-03-26 DIAGNOSIS — E890 Postprocedural hypothyroidism: Secondary | ICD-10-CM | POA: Diagnosis not present

## 2017-03-26 DIAGNOSIS — F0781 Postconcussional syndrome: Secondary | ICD-10-CM

## 2017-03-26 MED ORDER — AMITRIPTYLINE HCL 10 MG PO TABS: 10.0000 mg | ORAL_TABLET | Freq: Every day | ORAL | 0 refills | Status: AC

## 2017-03-26 MED ORDER — AMITRIPTYLINE HCL 10 MG PO TABS
ORAL_TABLET | ORAL | Status: AC
  Filled 2017-03-26: qty 1

## 2017-03-26 MED ORDER — AMITRIPTYLINE HCL 10 MG PO TABS
10.0000 mg | ORAL_TABLET | Freq: Every day | ORAL | Status: DC
  Administered 2017-03-26: 10 mg via ORAL
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
